# Patient Record
Sex: Male | Born: 1985 | Hispanic: Yes | Marital: Married | Smoking: Never smoker
Health system: Southern US, Community
[De-identification: ages and names within clinical notes are randomized; demographics above are authoritative.]

---

## 2018-03-14 ENCOUNTER — Ambulatory Visit (HOSPITAL_COMMUNITY)
Admission: EM | Admit: 2018-03-14 | Discharge: 2018-03-14 | Disposition: A | Discharge status: Home / Self Care | Payer: BLUE CROSS/BLUE SHIELD | Attending: Family Medicine | Admitting: Family Medicine

## 2018-03-14 ENCOUNTER — Encounter (HOSPITAL_COMMUNITY): Payer: Self-pay | Admitting: Emergency Medicine

## 2018-03-14 ENCOUNTER — Other Ambulatory Visit: Payer: Self-pay

## 2018-03-14 DIAGNOSIS — M79642 Pain in left hand: Secondary | ICD-10-CM

## 2018-03-14 MED ORDER — DICLOFENAC SODIUM 75 MG PO TBEC: 75.0000 mg | DELAYED_RELEASE_TABLET | Freq: Two times a day (BID) | ORAL | 0 refills | Status: AC

## 2018-03-14 NOTE — ED Provider Notes (Signed)
[facility] CARE CENTER   [id number] 03/14/18 Arrival Time: 1209  ASSESSMENT & PLAN:  1. Left hand pain    Question tendonitis. Discussed.  Meds ordered this encounter  Medications  . diclofenac (VOLTAREN) 75 MG EC tablet    Sig: Take 1 tablet (75 mg total) by mouth 2 (two) times daily.    Dispense:  14 tablet    Refill:  0   Work note provided with one week of restrictions.  Follow-up Information    [facility] In 1 week.   Specialty:  Urgent Care Why:  If not improving. Contact information: [address] [place] [address] [phone]         Reviewed expectations re: course of current medical issues. Questions answered. Outlined signs and symptoms indicating need for more acute intervention. Patient verbalized understanding. After Visit Summary given.  SUBJECTIVE: History from: patient. Larz Bissette is a 33 y.o. male who reports intermittent mild to moderate pain of his left hand/5th finger; described as sharp without radiation. Onset: gradual, over the past 3-4 days. 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 Symptoms have waxed and waned since beginning. Aggravating factors: repetitive movements of his left fifth finger. Alleviating factors: rest. Associated symptoms: none reported. Extremity sensation changes or weakness: none. Self treatment: has taken Tylenol once or twice; not much help. History of similar: no.  History reviewed. No pertinent surgical history.   ROS: As per HPI.   OBJECTIVE:  Vitals:   03/14/18 1223  BP: (!) 145/79  Pulse: 74  Resp: 18  Temp: 98.4 F (36.9 C)  TempSrc: Oral  SpO2: 100%    General appearance: alert; no distress Extremities: . LUE: warm and well perfused; poorly localized mild tenderness over left fifth metacarpal and MCP joint; mild crepitus noted at MCP joint with flexion/extension of fifth finger;  without gross deformities; with no swelling; with no bruising; ROM: normal CV: brisk extremity capillary refill of LUE; 2+ radial pulse of LUE. Skin: warm and dry; no visible rashes Neurologic: gait normal; normal reflexes of RUE and LUE; normal sensation of RUE and LUE; normal strength of RUE and LUE Psychological: alert and cooperative; normal mood and affect  No Known Allergies   Social History   Socioeconomic History  . Marital status: Married    Spouse name: Not on file  . Number of children: Not on file  . Years of education: Not on file  . Highest education level: Not on file  Occupational History  . Not on file  Social Needs  . Financial resource strain: Not on file  . Food insecurity:    Worry: Not on file    Inability: Not on file  . Transportation needs:    Medical: Not on file    Non-medical: Not on file  Tobacco Use  . Smoking status: Never Smoker  Substance and Sexual Activity  . Alcohol use: Never    Frequency: Never  . Drug use: Never  . Sexual activity: Not on file  Lifestyle  . Physical activity:    Days per week: Not on file    Minutes per session: Not on file  . Stress: Not on file  Relationships  . Social connections:    Talks on phone: Not on file    Gets together: Not on file    Attends religious service: Not on file    Active member of club or organization: Not  on file    Attends meetings of clubs or organizations: Not on file    Relationship status: Not on file  Other Topics Concern  . Not on file  Social History Narrative  . Not on file   Family History  Problem Relation Age of Onset  . Healthy Mother   . Cancer Father    History reviewed. No pertinent surgical history.    Mardella Layman, MD 03/14/18 1323

## 2018-03-14 NOTE — ED Triage Notes (Signed)
Movement of left little finger generates pain in lateral hand.  With wrist movement feel same pain in hand, but not that bad.  No known injury.

## 2018-03-26 ENCOUNTER — Other Ambulatory Visit: Payer: Self-pay

## 2018-03-26 ENCOUNTER — Emergency Department (HOSPITAL_COMMUNITY)
Admission: EM | Admit: 2018-03-26 | Discharge: 2018-03-26 | Disposition: A | Discharge status: Home / Self Care | Payer: BLUE CROSS/BLUE SHIELD | Attending: Emergency Medicine | Admitting: Emergency Medicine

## 2018-03-26 ENCOUNTER — Encounter (HOSPITAL_COMMUNITY): Payer: Self-pay | Admitting: Emergency Medicine

## 2018-03-26 ENCOUNTER — Emergency Department (HOSPITAL_COMMUNITY): Payer: BLUE CROSS/BLUE SHIELD

## 2018-03-26 DIAGNOSIS — M79642 Pain in left hand: Secondary | ICD-10-CM | POA: Insufficient documentation

## 2018-03-26 MED ORDER — NAPROXEN 500 MG PO TABS: 500.0000 mg | ORAL_TABLET | Freq: Two times a day (BID) | ORAL | 0 refills | Status: AC

## 2018-03-26 NOTE — ED Triage Notes (Signed)
Pt in with c/o L hand pain x 2 wks - went to UC 1.5wks ago and was given anti-inflammatory meds, now finished, but pain is worse. States it hurts to move L pinky

## 2018-03-26 NOTE — ED Notes (Signed)
Patient verbalizes understanding of discharge instructions. Opportunity for questioning and answers were provided. Armband removed by staff, pt discharged from ED. Ambulated out to lobby  

## 2018-03-26 NOTE — ED Provider Notes (Signed)
[facility] EMERGENCY DEPARTMENT Provider Note   CSN: [id number] Arrival date & time: 03/26/18  8938    History   Chief Complaint Chief Complaint  Patient presents with  . Hand Pain    HPI Douglas Gomez is a 33 y.o. male who presents with left hand pain.  Patient is left-hand dominant.  He states that about 2 weeks ago he he started to have pain over the medial aspect of the dorsal wrist at the base of the 5th metacarpal.  It is a sharp pain. The pain is constant but worse with movement.  He went to urgent care and they gave him Voltaren which he took without relief.  He states that the pain is now worse.  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  He has not tried any other therapy such as ice or bracing.  He has not had any redness or swelling.  He was told it was tendinitis.  No injury    HPI  History reviewed. No pertinent past medical history.  There are no active problems to display for this patient.   History reviewed. No pertinent surgical history.      Home Medications    Prior to Admission medications   Medication Sig Start Date End Date Taking? Authorizing Provider  diclofenac (VOLTAREN) 75 MG EC tablet Take 1 tablet (75 mg total) by mouth 2 (two) times daily. 03/14/18   Mardella Layman, MD    Family History Family History  Problem Relation Age of Onset  . Healthy Mother   . Cancer Father     Social History Social History   Tobacco Use  . Smoking status: Never Smoker  . Smokeless tobacco: Never Used  Substance Use Topics  . Alcohol use: Never    Frequency: Never  . Drug use: Never     Allergies   Patient has no known allergies.   Review of Systems Review of Systems  Constitutional: Negative for fever.  Musculoskeletal: Positive for arthralgias. Negative for joint swelling.  Skin: Negative for color change and wound.  Neurological: Negative for weakness and numbness.     Physical Exam Updated Vital Signs Wt  112.5 kg   Physical Exam Vitals signs and nursing note reviewed.  Constitutional:      General: He is not in acute distress.    Appearance: Normal appearance. He is well-developed.  HENT:     Head: Normocephalic and atraumatic.  Eyes:     General: No scleral icterus.       Right eye: No discharge.        Left eye: No discharge.     Conjunctiva/sclera: Conjunctivae normal.     Pupils: Pupils are equal, round, and reactive to light.  Neck:     Musculoskeletal: Normal range of motion.  Cardiovascular:     Rate and Rhythm: Normal rate.  Pulmonary:     Effort: Pulmonary effort is normal. No respiratory distress.  Abdominal:     General: There is no distension.  Musculoskeletal:     Comments: Left hand: No obvious swelling, deformity, or warmth. Point tenderness over dorsal medial wrist. FROM of wrist and fingers. 5/5 grip strength. N/V intact.   Skin:    General: Skin is warm and dry.  Neurological:     Mental Status: He is alert and oriented to person, place, and time.  Psychiatric:        Behavior: Behavior normal.      ED  Treatments / Results  Labs (all labs ordered are listed, but only abnormal results are displayed) Labs Reviewed - No data to display  EKG None  Radiology Dg Wrist Complete Left  Result Date: 03/26/2018 CLINICAL DATA:  Pain at the base of the fourth and fifth metacarpals. No injury. EXAM: LEFT WRIST - COMPLETE 3+ VIEW COMPARISON:  None. FINDINGS: There is no evidence of fracture or dislocation. There is no evidence of arthropathy or other focal bone abnormality. Soft tissues are unremarkable. IMPRESSION: Negative. Electronically Signed   By: Obie Dredge M.D.   On: 03/26/2018 09:48    Procedures Procedures (including critical care time)  Medications Ordered in ED Medications - No data to display   Initial Impression / Assessment and Plan / ED Course  I have reviewed the triage vital signs and the nursing notes.  Pertinent labs & imaging  results that were available during my care of the patient were reviewed by me and considered in my medical decision making (see chart for details).  33 year old male presents with atraumatic left hand pain for 2 weeks.  His vitals are normal.  There is no obvious signs of swelling or redness on exam.  X-ray is negative.  He was given a wrist splint and prescription for naproxen.  He is given follow-up with hand surgery.  Final Clinical Impressions(s) / ED Diagnoses   Final diagnoses:  Left hand pain    ED Discharge Orders    None       Bethel Born, PA-C 03/26/18 1049    Azalia Bilis, MD 03/26/18 1056

## 2018-03-26 NOTE — ED Notes (Signed)
Patient transported to X-ray 

## 2018-03-26 NOTE — Discharge Instructions (Signed)
Take Naproxen twice a day for the next 5 days Wear wrist splint to prevent repetitive and aggravating movements of the hand and wrist If you are not improving, please follow up with hand surgery

## 2019-10-01 IMAGING — CR DG WRIST COMPLETE 3+V*L*
4 series · 4 of 4 positions shown · non-contrast
Comparison: None.

CLINICAL DATA: Pain at the base of the fourth and fifth
metacarpals. No injury.

EXAM:
LEFT WRIST - COMPLETE 3+ VIEW

[wrist pa]
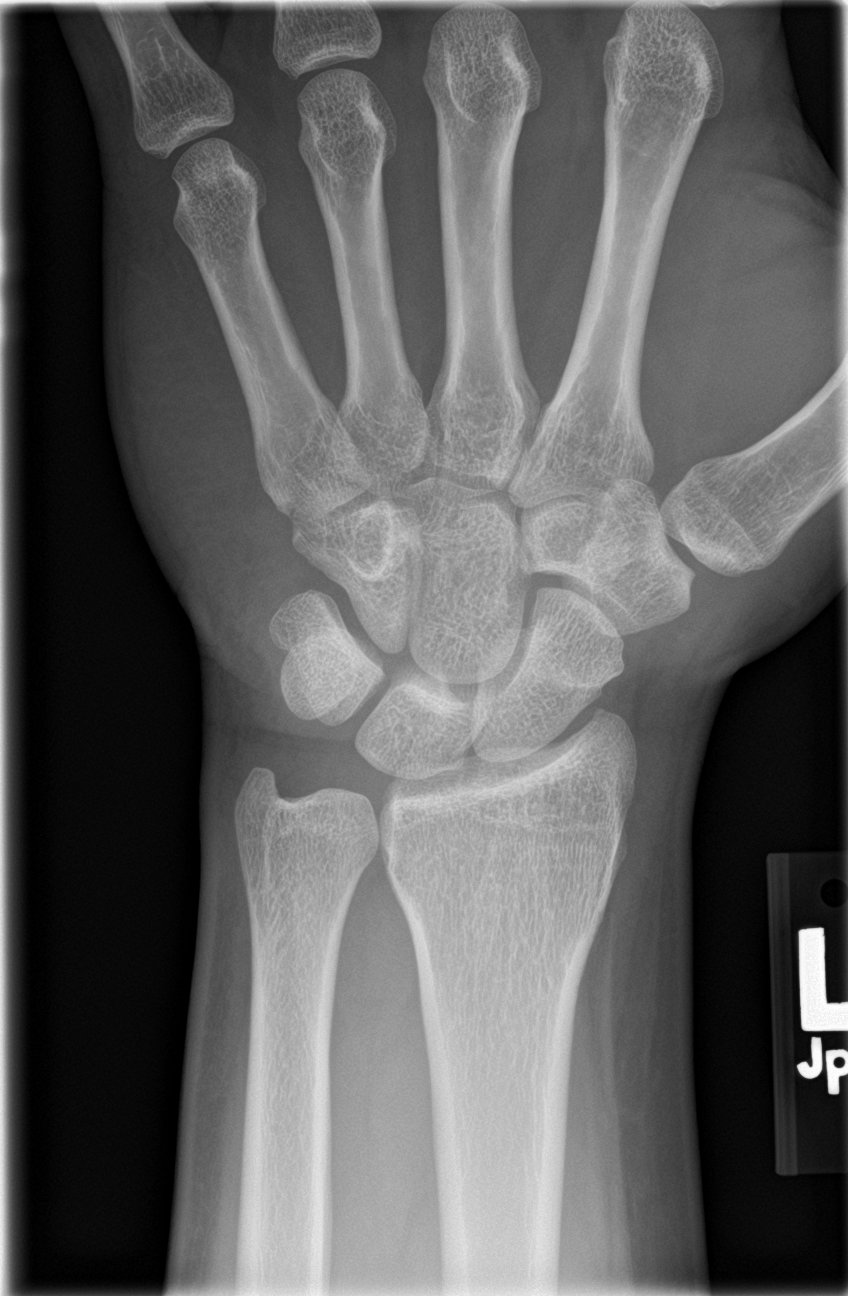

[wrist obl]
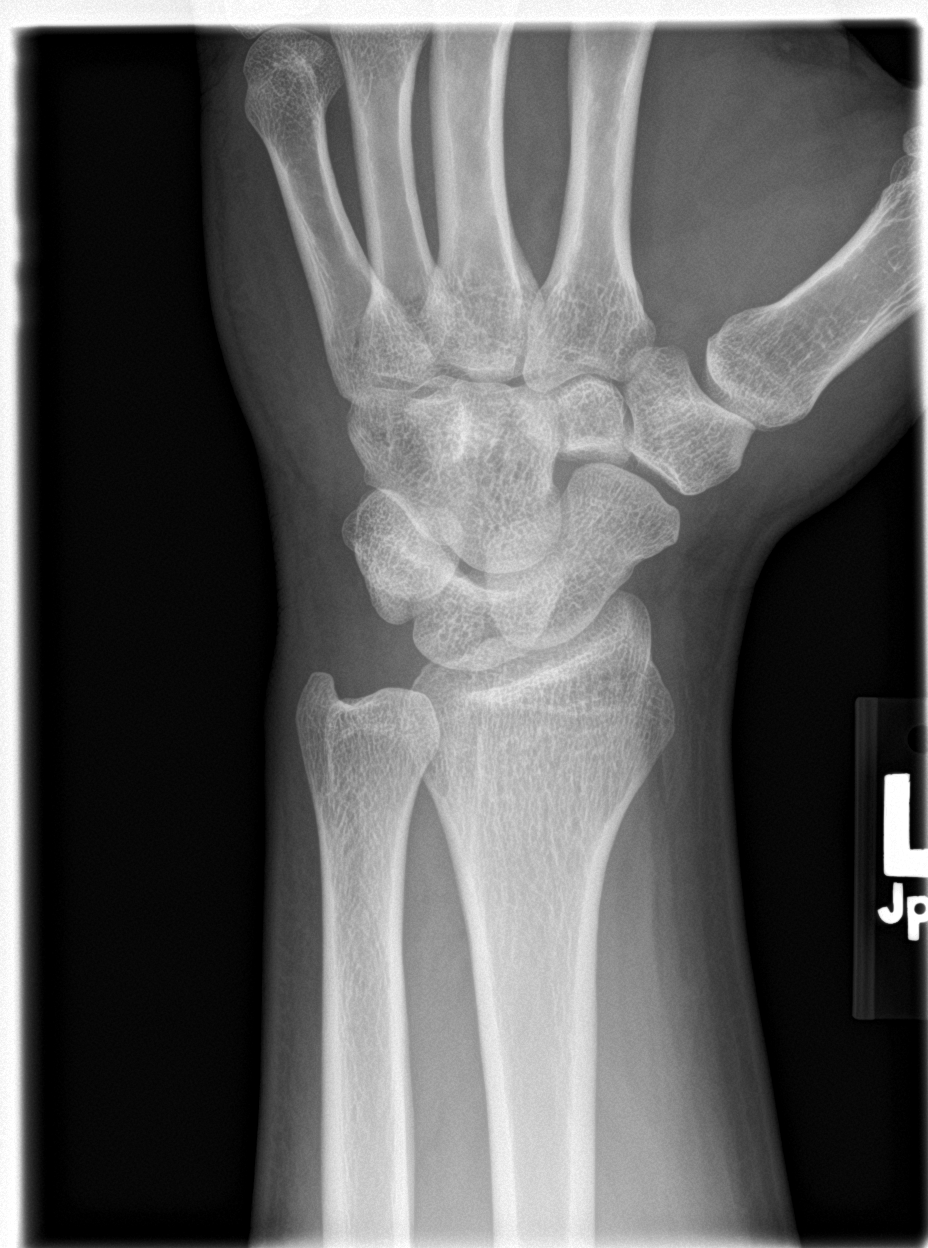

[wrist lat]
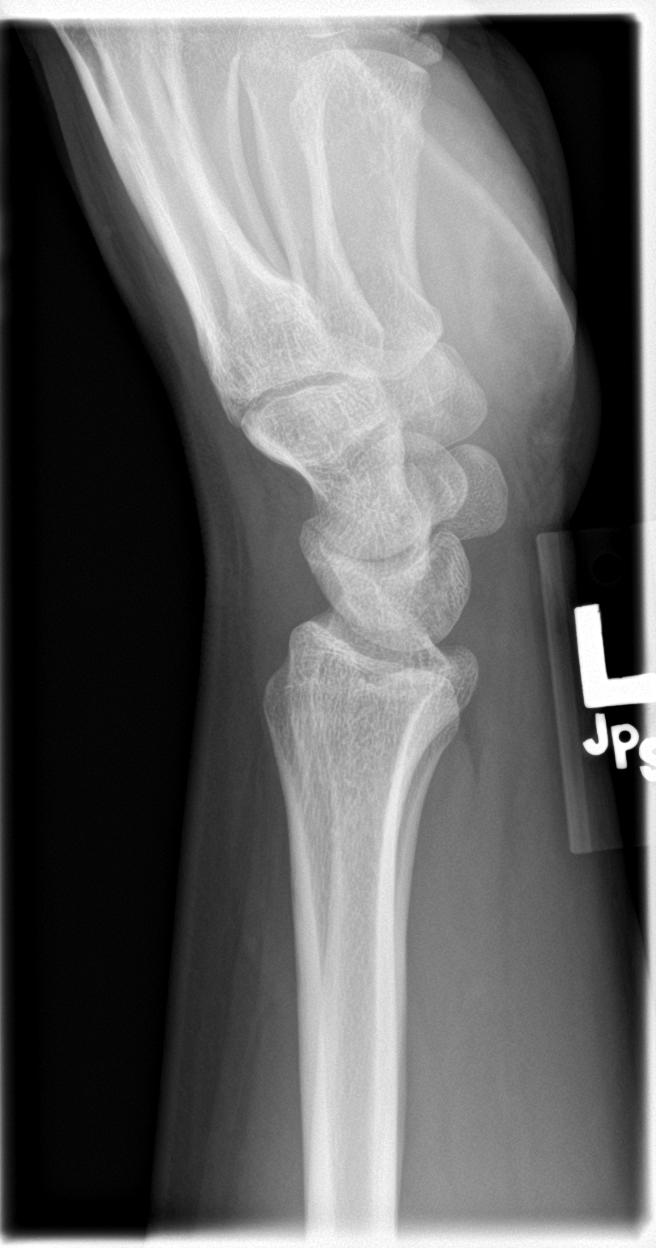

[wrist navicular]
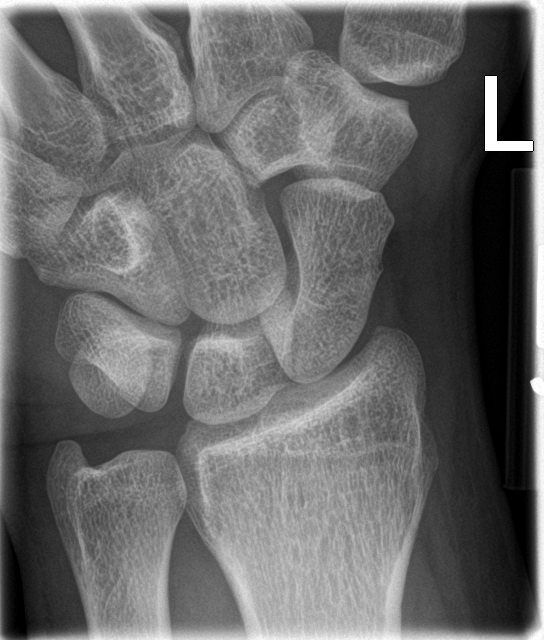

[4 of 4 positions shown; findings below may reference images not displayed]

FINDINGS: There is no evidence of fracture or dislocation. There is no
evidence of arthropathy or other focal bone abnormality. Soft
tissues are unremarkable.
IMPRESSION: Negative.
